# Patient Record
Sex: Male | Born: 1995 | Race: White | Hispanic: No | Marital: Single | Smoking: Never smoker
Health system: Southern US, Community
[De-identification: ages and names within clinical notes are randomized; demographics above are authoritative.]

## PROBLEM LIST (undated history)

## (undated) HISTORY — PX: ANEURYSM COILING: SHX5349

---

## 2020-06-17 ENCOUNTER — Emergency Department (HOSPITAL_COMMUNITY): Payer: BC Managed Care – PPO

## 2020-06-17 ENCOUNTER — Other Ambulatory Visit: Payer: Self-pay

## 2020-06-17 ENCOUNTER — Emergency Department (HOSPITAL_COMMUNITY)
Admission: EM | Admit: 2020-06-17 | Discharge: 2020-06-17 | Disposition: A | Discharge status: Home / Self Care | Payer: BC Managed Care – PPO | Attending: Emergency Medicine | Admitting: Emergency Medicine

## 2020-06-17 ENCOUNTER — Encounter (HOSPITAL_COMMUNITY): Payer: Self-pay | Admitting: *Deleted

## 2020-06-17 DIAGNOSIS — R55 Syncope and collapse: Secondary | ICD-10-CM | POA: Insufficient documentation

## 2020-06-17 DIAGNOSIS — R519 Headache, unspecified: Secondary | ICD-10-CM | POA: Diagnosis not present

## 2020-06-17 DIAGNOSIS — E860 Dehydration: Secondary | ICD-10-CM

## 2020-06-17 DIAGNOSIS — R42 Dizziness and giddiness: Secondary | ICD-10-CM | POA: Diagnosis present

## 2020-06-17 LAB — CBC
HCT: 46.9 % (ref 39.0–52.0)
Hemoglobin: 15.2 g/dL (ref 13.0–17.0)
MCH: 27.6 pg (ref 26.0–34.0)
MCHC: 32.4 g/dL (ref 30.0–36.0)
MCV: 85.3 fL (ref 80.0–100.0)
Platelets: 234 10*3/uL (ref 150–400)
RBC: 5.5 MIL/uL (ref 4.22–5.81)
RDW: 12.8 % (ref 11.5–15.5)
WBC: 8.1 10*3/uL (ref 4.0–10.5)
nRBC: 0 % (ref 0.0–0.2)

## 2020-06-17 LAB — BASIC METABOLIC PANEL
Anion gap: 11 (ref 5–15)
BUN: 12 mg/dL (ref 6–20)
CO2: 24 mmol/L (ref 22–32)
Calcium: 9.5 mg/dL (ref 8.9–10.3)
Chloride: 105 mmol/L (ref 98–111)
Creatinine, Ser: 1.14 mg/dL (ref 0.61–1.24)
GFR calc Af Amer: >60 mL/min (ref >60)
GFR calc non Af Amer: >60 mL/min (ref >60)
Glucose, Bld: 112 mg/dL — ABNORMAL HIGH (ref 70–99)
Potassium: 3.7 mmol/L (ref 3.5–5.1)
Sodium: 140 mmol/L (ref 135–145)

## 2020-06-17 LAB — URINALYSIS, ROUTINE W REFLEX MICROSCOPIC
Bilirubin Urine: NEGATIVE
Glucose, UA: NEGATIVE mg/dL
Hgb urine dipstick: NEGATIVE
Ketones, ur: NEGATIVE mg/dL
Leukocytes,Ua: NEGATIVE
Nitrite: NEGATIVE
Protein, ur: NEGATIVE mg/dL
Specific Gravity, Urine: 1.012 (ref 1.005–1.030)
pH: 6 (ref 5.0–8.0)

## 2020-06-17 MED ORDER — LACTATED RINGERS IV BOLUS
1000.0000 mL | Freq: Once | INTRAVENOUS | Status: AC
  Administered 2020-06-17: 1000 mL via INTRAVENOUS

## 2020-06-17 MED ORDER — IOHEXOL 350 MG/ML SOLN
50.0000 mL | Freq: Once | INTRAVENOUS | Status: AC | PRN
  Administered 2020-06-17: 50 mL via INTRAVENOUS

## 2020-06-17 NOTE — Discharge Instructions (Addendum)
You were seen today for an episode of syncope.  Your CTA is reassuring.  No recurrent bleed.  You were dehydrated.  Make sure that you are staying hydrated.

## 2020-06-17 NOTE — ED Notes (Signed)
Patient verbalizes understanding of discharge instructions. Opportunity for questioning and answers were provided. Arm band removed by staff, patient discharged from ED. 

## 2020-06-17 NOTE — ED Triage Notes (Signed)
Patient presents to ed via [facility] c/o dizziness this am states he had a syncopal episode. 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

## 2020-06-17 NOTE — ED Provider Notes (Signed)
Transfer of Care Note I assumed care of Casey Leblanc on 06/17/2020 at @NOWNR @.  Briefly, Casey Leblanc is a 24 y.o. male who:  P/w syncope.  Pt woke up w/ lightheadedness this am after playing golf yesterday.  CT head ok.  Labs ok.  No current sx  Pt has a h/o previous aneurysmal bleed s/p clipping.    Will obtain CT-angio to eval for further aneurysm  The plan includes:  F/u CT angio and likely d/c   Please refer to the original provider's note for additional information regarding the care of 21.  Reassessment: Vital Signs:  The most current vitals were  Vitals:   06/17/20 0942 06/17/20 1310  BP: 113/87 122/78  Pulse: 66 74  Resp: (!) 174 15  Temp:    SpO2: 100% 100%    Hemodynamics:  The patient is hemodynamically stable. Mental Status:  The patient is Alert  Additional MDM: CTA head negative for acute abnormality.  Pt endorsing improvement sx after fluids.  Dizziness likely orthostatic and improved w/ rehydration.  Feel pt is stable for d/c at this time.  Pt discharged in stable condition w/o further events.     06/19/20, MD 06/17/20 1716    06/19/20, DO 06/17/20 1756

## 2020-06-17 NOTE — ED Provider Notes (Signed)
[facility] EMERGENCY DEPARTMENT Provider Note   CSN: [id number] Arrival date & time: 06/17/20  6378     History Chief Complaint  Patient presents with  . Near Syncope  . Dizziness    Casey Leblanc is a 24 y.o. male.  HPI  Patient presents to the ED via EMS from home with complaint of syncope.  The patient reports he was waking up this morning when he had a odd sensation in his head.  He has had sensation similar to this when he had a previous aneurysmal bleed that required clipping approximately 1 year ago.  Patient then subsequently developed some dizziness upon standing and had a syncopal episode at that time that was witnessed by a friend.  Patient was only out for a brief second or 2 before coming around.  Patient does endorse a mild headache but otherwise denies any chest pain or shortness of breath.  No recent sick contacts.  Patient does report being outside yesterday playing golf and perhaps being slightly dehydrated.  Some alcoholic beverages overnight.  No family history of sudden cardiac death.     History reviewed. No pertinent past medical history.  There are no problems to display for this patient.   Past Surgical History:  Procedure Laterality Date  . ANEURYSM COILING         No family history on file.  Social History   Tobacco Use  . Smoking status: Never Smoker  . Smokeless tobacco: Never Used  Substance Use Topics  . Alcohol use: Not Currently  . Drug use: Never    Home Medications Prior to Admission medications   Medication Sig Start Date End Date Taking? Authorizing Provider  acetaminophen (TYLENOL) 500 MG tablet Take 1,000 mg by mouth every 6 (six) hours as needed for mild pain or headache.   Yes [provider]  hydrocortisone 2.5 % cream Apply 1 application topically 2 (two) times daily as needed (to affected areas- for itching).  04/23/20  Yes [provider]  ibuprofen (ADVIL) 200 MG tablet Take 400-600  mg by mouth every 6 (six) hours as needed for headache or mild pain.   Yes [provider]    Allergies    Patient has no known allergies.  Review of Systems   Review of Systems  Constitutional: Negative for chills and fever.  HENT: Negative for ear pain and sore throat.   Eyes: Negative for pain and visual disturbance.  Respiratory: Negative for cough and shortness of breath.   Cardiovascular: Negative for chest pain and palpitations.  Gastrointestinal: Negative for abdominal pain, diarrhea, nausea and vomiting.  Genitourinary: Negative for dysuria and hematuria.  Musculoskeletal: Negative for arthralgias and back pain.  Skin: Negative for color change and rash.  Neurological: Positive for syncope. Negative for seizures.  All other systems reviewed and are negative.   Physical Exam Updated Vital Signs BP 122/78 (BP Location: Right Arm)   Pulse 74   Temp 98.4 F (36.9 C) (Oral)   Resp 15   Ht 5\' 10"  (1.778 m)   Wt 92.1 kg   SpO2 100%   BMI 29.13 kg/m   Physical Exam Vitals and nursing note reviewed.  Constitutional:      General: He is not in acute distress.    Appearance: Normal appearance. He is well-developed and normal weight. He is not ill-appearing or toxic-appearing.  HENT:     Head: Normocephalic and atraumatic.  Eyes:     Extraocular Movements: Extraocular movements intact.  Conjunctiva/sclera: Conjunctivae normal.     Pupils: Pupils are equal, round, and reactive to light.  Cardiovascular:     Rate and Rhythm: Normal rate and regular rhythm.     Pulses: Normal pulses.     Heart sounds: No murmur heard.   Pulmonary:     Effort: Pulmonary effort is normal. No respiratory distress.     Breath sounds: Normal breath sounds.  Abdominal:     General: There is no distension.     Palpations: Abdomen is soft.     Tenderness: There is no abdominal tenderness.  Musculoskeletal:     Cervical back: Normal range of motion and neck supple. No rigidity.       Right lower leg: No edema.     Left lower leg: No edema.  Skin:    General: Skin is warm and dry.     Capillary Refill: Capillary refill takes less than 2 seconds.  Neurological:     General: No focal deficit present.     Mental Status: He is alert and oriented to person, place, and time. Mental status is at baseline.  Psychiatric:        Mood and Affect: Mood normal.        Behavior: Behavior normal.     ED Results / Procedures / Treatments   Labs (all labs ordered are listed, but only abnormal results are displayed) Labs Reviewed  BASIC METABOLIC PANEL - Abnormal; Notable for the following components:      Result Value   Glucose, Bld 112 (*)    All other components within normal limits  CBC  URINALYSIS, ROUTINE W REFLEX MICROSCOPIC  CBG MONITORING, ED    EKG EKG Interpretation  Date/Time:  Sunday June 17 2020 06:55:58 EDT Ventricular Rate:  82 PR Interval:  154 QRS Duration: 94 QT Interval:  362 QTC Calculation: 422 R Axis:   88 Text Interpretation: Normal sinus rhythm Normal ECG Confirmed by Ross Marcus ([id number]) on 06/17/2020 2:12:38 PM   Radiology CT HEAD WO CONTRAST  Result Date: 06/17/2020 CLINICAL DATA:  History of aneurysm with subsequent syncope. EXAM: CT HEAD WITHOUT CONTRAST TECHNIQUE: Contiguous axial images were obtained from the base of the skull through the vertex without intravenous contrast. COMPARISON:  None. FINDINGS: Brain: No evidence of acute infarction, hemorrhage, hydrocephalus, extra-axial collection or mass lesion/mass effect. 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  No hyperdense vessel Skull: Right retromastoid craniectomy with plating. Sinuses/Orbits: No acute finding. Mild mucosal thickening in the paranasal sinuses. Craniotomy reaches into right mastoid air cells without mastoid effusion or erosion. IMPRESSION: 1. No acute finding. 2. 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 Electronically Signed   By: Marnee Spring M.D.   On: 06/17/2020 11:53    Procedures Procedures (including critical care time)  Medications Ordered in ED Medications  lactated ringers bolus 1,000 mL (1,000 mLs Intravenous New Bag/Given 06/17/20 1454)    ED Course   Casey Leblanc is a 24 y.o. male with PMHx listed that presents to the Emergency Department complaint of Near Syncope and Dizziness  ED Course: Initial exam completed.   Well-appearing and hemodynamically stable.  Nontoxic and afebrile.  Physical exam significant for age-appropriate 24 year old male with no acute focal neurologic deficits, abdomen soft, nondistended, nontender, strong radial pulses, and distal perfusion intact.  Initial differential includes arrhythmia, electrolyte abnormalities, spontaneous SAH, hypoglycemia, and orthostatic hypotension.    Triage work-up reviewed.  BMP with no acute electrolyte abnormalities requiring emergent intervention.  CBC without evidence of leukocytosis or leukopenia stable platelet count.  UA without signs of infection or dehydration.  CT head without acute findings and does show sequela of prior aneurysm clipping.  EKG sinus rhythm at rate 82 with otherwise normal intervals and no acute ST changes.  Given this patient's history of spontaneous aneurysmal bleed with syncope today and headache that feels similar to past, we will obtain a CTA head and neck for definitive rule out.  Additionally 1 L LR bolus for fluid resuscitation.  Slightly orthostatic.  Diagnostics Vital Signs: reviewed Labs: reviewed and significant findings discussed above Imaging: personally reviewed images interpreted by radiology EKG: reviewed Records: nursing notes along with previous records reviewed and pertinent data discussed   Consults:  none   Reevaluation/Disposition:  Care was transitioned to the oncoming provider pending CTA head and neck.  If the  study is normal, patient can be discharged home.    Campbell Riches, MD Emergency Medicine, PGY-3   Note: Dragon medical dictation software was used in the creation of this note.    Final Clinical Impression(s) / ED Diagnoses Final diagnoses:  Syncope, unspecified syncope type    Rx / DC Orders ED Discharge Orders    None       Nino Parsley, MD 06/17/20 1519    Shon Baton, MD 06/17/20 1536

## 2020-06-17 NOTE — ED Notes (Signed)
Pt ambulated, steady gait, not complaining of dizziness. Pt however states he has a headache in the front of his head.

## 2020-06-17 NOTE — ED Notes (Signed)
Patient transported to CT 

## 2021-04-15 IMAGING — CT CT ANGIO HEAD
1 of 4 series · 8 of 47 positions shown · IV contrast (Omni 300)
Comparison: Head CT earlier same day

CLINICAL DATA: Dizziness.  History of aneurysm clip.

EXAM:
CT ANGIOGRAPHY HEAD
TECHNIQUE: Multidetector CT imaging of the head was performed using the
standard protocol during bolus administration of intravenous
contrast. Multiplanar CT image reconstructions and MIPs were
obtained to evaluate the vascular anatomy.
CONTRAST:  Not documented.

[Series 5: cow 2.0 · axial · 0.45mm/px · z∈[-176,-52]mm · 8 of 80 slices shown]
[im 9/80  brain]
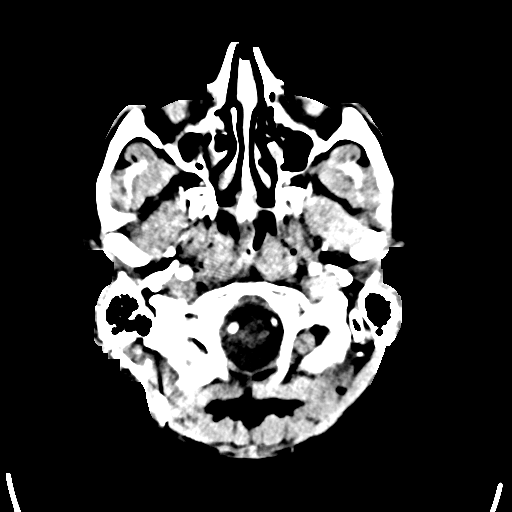
[im 18/80  bone]
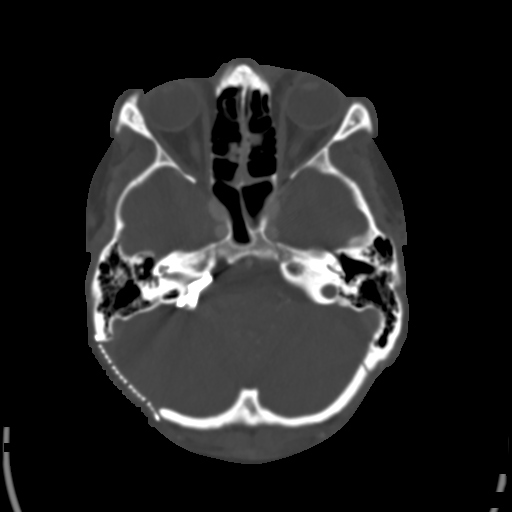
[im 27/80  brain]
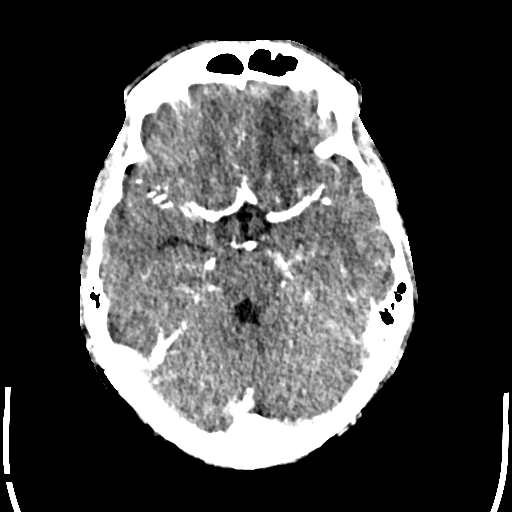
[im 36/80  bone]
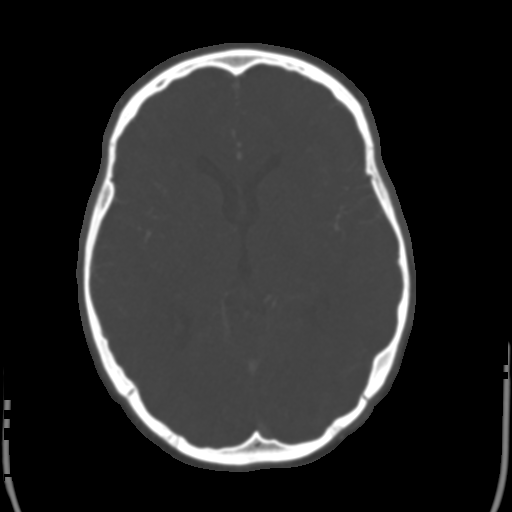
[im 44/80  brain]
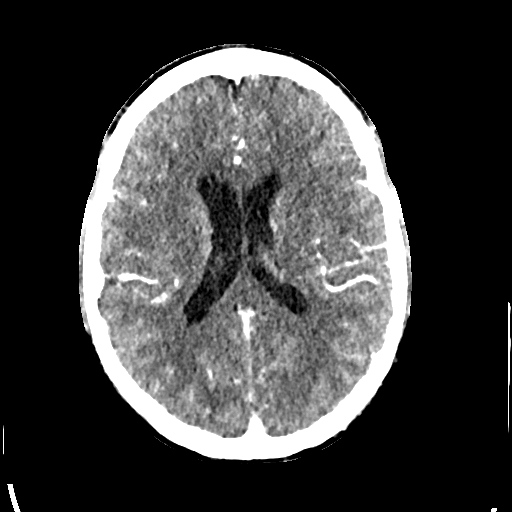
[im 53/80  bone]
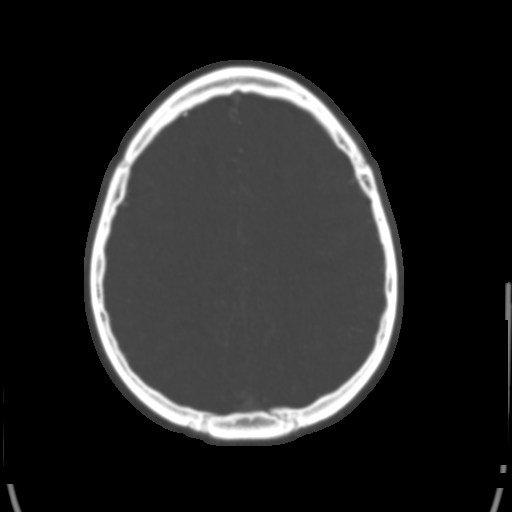
[im 62/80  brain]
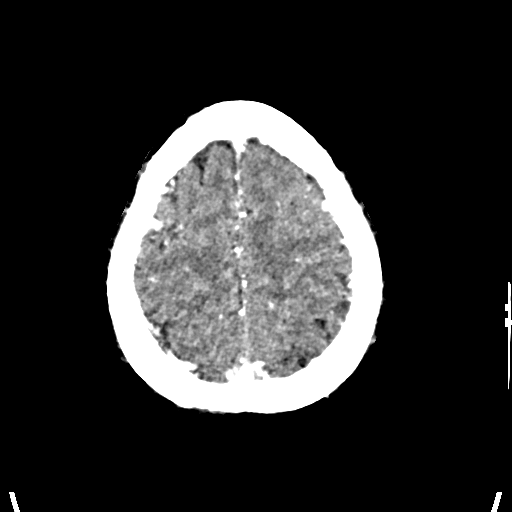
[im 71/80  bone]
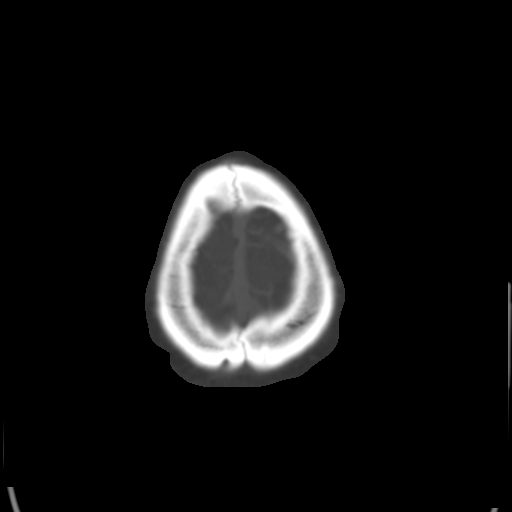

[8 of 47 positions shown; findings below may reference images not displayed]

FINDINGS: CTA HEAD

Anterior circulation: Both internal carotid arteries are widely
patent through the skull base and siphon regions. The anterior and
middle cerebral vessels are normal without proximal stenosis,
aneurysm or vascular malformation. Fetal origin right PCA.

Posterior circulation: Both vertebral arteries are widely patent
through the foramen magnum with the right being dominant. No basilar
stenosis. Posterior circulation branch vessels appear normal.

Venous sinuses: Patent and normal.

Anatomic variants: None significant.
IMPRESSION: Previous right occipital craniectomy and cranioplasty with multiple
vascular clips in the right CP angle region. No evidence of aneurysm
or vascular malformation in that region or elsewhere.
